# Patient Record
Sex: Male | Born: 1990 | Hispanic: No | Marital: Single | Smoking: Current every day smoker
Health system: Southern US, Community
[De-identification: ages and names within clinical notes are randomized; demographics above are authoritative.]

---
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
## 2016-02-23 ENCOUNTER — Ambulatory Visit (INDEPENDENT_AMBULATORY_CARE_PROVIDER_SITE_OTHER): Payer: BC Managed Care – PPO | Admitting: Physician Assistant

## 2016-02-23 VITALS — BP 118/80 | HR 75 | Temp 98.2°F | Resp 18 | Ht 66.5 in | Wt 153.2 lb

## 2016-02-23 DIAGNOSIS — M25531 Pain in right wrist: Secondary | ICD-10-CM

## 2016-02-23 DIAGNOSIS — Z23 Encounter for immunization: Secondary | ICD-10-CM | POA: Diagnosis not present

## 2016-02-23 DIAGNOSIS — T23201A Burn of second degree of right hand, unspecified site, initial encounter: Secondary | ICD-10-CM

## 2016-02-23 DIAGNOSIS — M79641 Pain in right hand: Secondary | ICD-10-CM | POA: Diagnosis not present

## 2016-02-23 DIAGNOSIS — T22211A Burn of second degree of right forearm, initial encounter: Secondary | ICD-10-CM

## 2016-02-23 DIAGNOSIS — T22111A Burn of first degree of right forearm, initial encounter: Secondary | ICD-10-CM | POA: Diagnosis not present

## 2016-02-23 MED ORDER — SILVER SULFADIAZINE 1 % EX CREA: 1.0000 "application " | TOPICAL_CREAM | Freq: Every day | CUTANEOUS | Status: DC

## 2016-02-23 NOTE — Patient Instructions (Addendum)
Bandage to be change every 12 hours and reapply Silvadene cream to affected area . Please call our office if you have any of the following : Chills, fever, nausea, sweating. Or you can go to the emergency department. Please take 800mg  of Ibuprofen every 8 hours as needed for pain. Please follow up with me on Tuesday, but call office first.     IF you received an x-ray today, you will receive an invoice from [facility]. Please contact [facility] at [phone] with questions or concerns regarding your invoice.   IF you received labwork today, you will receive an invoice from [facility]. Please contact [facility] at [phone] with questions or concerns regarding your invoice.   Our billing staff will not be able to assist you with questions regarding bills from these companies.  You will be contacted with the lab results as soon as they are available. The fastest way to get your results is to activate your My Chart account. Instructions are located on the last page of this paperwork. If you have not heard from us regarding the results in 2 weeks, please contact this office.
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
## 2016-02-27 ENCOUNTER — Ambulatory Visit (INDEPENDENT_AMBULATORY_CARE_PROVIDER_SITE_OTHER): Payer: BC Managed Care – PPO | Admitting: Physician Assistant

## 2016-02-27 VITALS — BP 112/62 | HR 60 | Temp 98.3°F | Resp 15 | Ht 66.5 in | Wt 156.2 lb

## 2016-02-27 DIAGNOSIS — T22111A Burn of first degree of right forearm, initial encounter: Secondary | ICD-10-CM

## 2016-02-27 MED ORDER — SILVER SULFADIAZINE 1 % EX CREA
1.0000 | TOPICAL_CREAM | Freq: Every day | CUTANEOUS | Status: DC
Start: 2016-02-27 — End: 2016-03-05
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
## 2016-02-27 NOTE — Patient Instructions (Signed)
     IF you received an x-ray today, you will receive an invoice from [facility]. Please contact [facility] at [phone] with questions or concerns regarding your invoice.   IF you received labwork today, you will receive an invoice from [facility]. Please contact [facility] at [phone] with questions or concerns regarding your invoice.   Our billing staff will not be able to assist you with questions regarding bills from these companies.  You will be contacted with the lab results as soon as they are available. The fastest way to get your results is to activate your My Chart account. Instructions are located on the last page of this paperwork. If you have not heard from us regarding the results in 2 weeks, please contact this office.      

## 2016-03-05 ENCOUNTER — Ambulatory Visit: Payer: BC Managed Care – PPO

## 2016-03-05 ENCOUNTER — Ambulatory Visit (INDEPENDENT_AMBULATORY_CARE_PROVIDER_SITE_OTHER): Payer: BC Managed Care – PPO | Admitting: Physician Assistant

## 2016-03-05 DIAGNOSIS — T22111A Burn of first degree of right forearm, initial encounter: Secondary | ICD-10-CM

## 2016-03-05 MED ORDER — SILVER SULFADIAZINE 1 % EX CREA: 1.0000 "application " | TOPICAL_CREAM | Freq: Every day | CUTANEOUS | 0 refills | Status: AC
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
